# Patient Record
Sex: Male | Born: 2000 | Race: Black or African American | Hispanic: No | Marital: Single | Smoking: Never smoker
Health system: Southern US, Community
[De-identification: ages and names within clinical notes are randomized; demographics above are authoritative.]

## PROBLEM LIST (undated history)

## (undated) DIAGNOSIS — R011 Cardiac murmur, unspecified: Secondary | ICD-10-CM

## (undated) DIAGNOSIS — M41129 Adolescent idiopathic scoliosis, site unspecified: Secondary | ICD-10-CM

## (undated) DIAGNOSIS — J40 Bronchitis, not specified as acute or chronic: Secondary | ICD-10-CM

## (undated) HISTORY — DX: Adolescent idiopathic scoliosis, site unspecified: M41.129

## (undated) HISTORY — PX: WISDOM TOOTH EXTRACTION: SHX21

---
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
## 2022-10-17 ENCOUNTER — Ambulatory Visit (INDEPENDENT_AMBULATORY_CARE_PROVIDER_SITE_OTHER): Payer: Medicaid Other | Admitting: Pediatrics

## 2022-10-17 ENCOUNTER — Encounter: Payer: Self-pay | Admitting: Pediatrics

## 2022-10-17 VITALS — BP 122/80 | HR 66 | Ht 71.06 in | Wt 249.4 lb

## 2022-10-17 DIAGNOSIS — J069 Acute upper respiratory infection, unspecified: Secondary | ICD-10-CM

## 2022-10-17 DIAGNOSIS — J101 Influenza due to other identified influenza virus with other respiratory manifestations: Secondary | ICD-10-CM | POA: Diagnosis not present

## 2022-10-17 LAB — POC SOFIA 2 FLU + SARS ANTIGEN FIA
Influenza A, POC: NEGATIVE
Influenza B, POC: POSITIVE — AB
SARS Coronavirus 2 Ag: NEGATIVE

## 2022-10-17 NOTE — Progress Notes (Signed)
Patient Name:  Daniel Chandler Date of Birth:  Jan 04, 2001 Age:  21 y.o. Date of Visit:  10/17/2022   Accompanied by:  self    (primary historianself) Interpreter:  none  Subjective:    Daniel Chandler  is a 21 y.o. here for  Chief Complaint  Patient presents with   Cough   Nasal Congestion    Accompanied by self    Cough This is a new problem. The current episode started in the past 7 days (7-8 days ago). The problem has been gradually improving. Associated symptoms include nasal congestion and rhinorrhea. Pertinent negatives include no chest pain, fever or sore throat. There is no history of asthma or environmental allergies.    Past Medical History:  Diagnosis Date   Adolescent idiopathic scoliosis    Thoracolumbar junction, 15 degrees of curvature with about 30 degrees of rotation, increased kyphosis of the thoracic spine.  Increased lordosis of the lumbar spine.   Asthma    Bronchitis    Heart murmur    Morbid obesity ([facility]) 08/27/2020   BMI is 38 at 21 years of age (greater than the 99th percentile)     History reviewed. No pertinent surgical history.   Family History  Problem Relation Age of Onset   Blindness Father    Diabetes type II Father    Hypertension Father    Heart attack Father    Stroke Father    Atrial fibrillation Father    Stroke Maternal Grandmother    Hypertension Maternal Grandmother    Diabetes type II Maternal Grandmother    Thyroid disease Maternal Grandmother    Cataracts Maternal Grandmother    Glaucoma Maternal Grandmother    Hypertension Maternal Grandfather    Diabetes type II Maternal Grandfather    Kidney cancer Maternal Grandfather    Kidney disease Maternal Grandfather    Cataracts Maternal Grandfather    Diabetes Paternal Grandmother    Hypertension Paternal Grandmother    Blindness Paternal Grandmother    Diabetes type II Paternal Grandmother    Early death Paternal Grandfather     Current Meds  Medication Sig   fluticasone  (FLONASE) 50 MCG/ACT nasal spray Place 1 spray into both nostrils daily.   Olopatadine HCl 0.2 % SOLN Apply 1 drop to eye daily.       No Known Allergies  Review of Systems  Constitutional:  Negative for fever.  HENT:  Positive for congestion and rhinorrhea. Negative for sore throat.   Respiratory:  Positive for cough.   Cardiovascular:  Negative for chest pain.  Gastrointestinal:  Negative for abdominal pain, diarrhea, nausea and vomiting.  Endo/Heme/Allergies:  Negative for environmental allergies.     Objective:   Blood pressure 122/80, pulse 66, height 5' 11.06" (1.805 m), weight 249 lb 6.4 oz (113.1 kg), SpO2 99 %.  Physical Exam Constitutional:      General: He is not in acute distress. HENT:     Right Ear: Tympanic membrane normal.     Left Ear: Tympanic membrane normal.     Nose: Congestion present. No rhinorrhea.     Mouth/Throat:     Pharynx: No oropharyngeal exudate or posterior oropharyngeal erythema.  Cardiovascular:     Pulses: Normal pulses.  Pulmonary:     Effort: Pulmonary effort is normal. No respiratory distress.     Breath sounds: Normal breath sounds. No wheezing.  Abdominal:     General: Bowel sounds are normal.     Palpations: Abdomen is soft.  Lymphadenopathy:  Cervical: No cervical adenopathy.      IN-HOUSE Laboratory Results:    Results for orders placed or performed in visit on 10/17/22  POC SOFIA 2 FLU + SARS ANTIGEN FIA  Result Value Ref Range   Influenza A, POC Negative Negative   Influenza B, POC Positive (A) Negative   SARS Coronavirus 2 Ag Negative Negative     Assessment and plan:   Patient is here for   1. Influenza due to influenza virus, type B  Symptoms are improving, started >7 days ago. Talked about supportive care and monitoring.   -Supportive care, symptom management, and monitoring were discussed -Monitor for fever, respiratory distress, and dehydration  -Indications to return to clinic and/or ER  reviewed -Use of nasal saline, cool mist humidifier, and fever control reviewed   2. Viral URI - POC SOFIA 2 FLU + SARS ANTIGEN FIA     Return if symptoms worsen or fail to improve.

## 2023-04-25 ENCOUNTER — Ambulatory Visit (INDEPENDENT_AMBULATORY_CARE_PROVIDER_SITE_OTHER): Payer: Medicaid Other | Admitting: Internal Medicine

## 2023-04-25 ENCOUNTER — Encounter: Payer: Self-pay | Admitting: Internal Medicine

## 2023-04-25 VITALS — BP 127/78 | HR 67 | Resp 16 | Ht 73.0 in | Wt 239.0 lb

## 2023-04-25 DIAGNOSIS — R7309 Other abnormal glucose: Secondary | ICD-10-CM | POA: Diagnosis not present

## 2023-04-25 DIAGNOSIS — E782 Mixed hyperlipidemia: Secondary | ICD-10-CM

## 2023-04-25 NOTE — Progress Notes (Unsigned)
HPI:[name] is a 22 y.o. male  who is not currently being treating for any chronic medical conditions who is here to establish care. Patient was followed by pediatrician before today. Previously treated for asthma but not currently needing inhaler. He was morbidly obese and losing weight. He has been making better food choices and purchases his own food since starting a full time job.  He works at [employer] 7 am to 7pm  , 2 days on and 2 days off. He has a hard time falling asleep sometimes and had bad dreams when taking melatonin. His parents lost their home to a fire and living with his grandmother currently. He also hit is nail with a hammer and would like it evaluated. He has an area on his right ribs which he would like to be evaluated.   Past Medical History:  Diagnosis Date   Adolescent idiopathic scoliosis    Thoracolumbar junction, 15 degrees of curvature with about 30 degrees of rotation, increased kyphosis of the thoracic spine.  Increased lordosis of the lumbar spine.   Asthma    Bronchitis    Heart murmur    Morbid obesity ([facility]) 08/27/2020   BMI is 14 at 22 years of age (greater than the 99th percentile)    Past Surgical History:  Procedure Laterality Date   WISDOM TOOTH EXTRACTION      Family History  Problem Relation Age of Onset   Blindness Father    Diabetes type II Father    Hypertension Father    Heart attack Father    Stroke Father    Atrial fibrillation Father    Stroke Maternal Grandmother    Hypertension Maternal Grandmother    Diabetes type II Maternal Grandmother    Thyroid disease Maternal Grandmother    Cataracts Maternal Grandmother    Glaucoma Maternal Grandmother    Hypertension Maternal Grandfather    Diabetes type II Maternal Grandfather    Kidney cancer Maternal Grandfather    Kidney disease Maternal Grandfather    Cataracts Maternal Grandfather    Diabetes Paternal Grandmother    Hypertension Paternal Grandmother    Blindness  Paternal Grandmother    Diabetes type II Paternal Grandmother    Early death Paternal Grandfather     Social History   Tobacco Use   Smoking status: Never    Passive exposure: Yes   Smokeless tobacco: Never   Tobacco comments:    mom smokes outside  Substance Use Topics   Alcohol use: No   Drug use: No      Physical Exam: Vitals:   04/25/23 1118  BP: 127/78  Pulse: 67  Resp: 16  SpO2: 97%  Weight: 239 lb (108.4 kg)  Height: 6\' 1"  (1.854 m)     Physical Exam Constitutional:      General: He is not in acute distress.    Appearance: He is obese.  Cardiovascular:     Rate and Rhythm: Normal rate and regular rhythm.     Heart sounds: No murmur heard. Pulmonary:     Effort: Pulmonary effort is normal.     Breath sounds: No wheezing or rales.  Musculoskeletal:     Thoracic back: Scoliosis present.     Comments: Well circumscribed nodule at bottom of right ribs cage  Skin:    Comments: Subungual hematoma  Psychiatric:        Mood and Affect: Mood normal.        Behavior: Behavior normal.  Assessment & Plan:   Adolescent idiopathic scoliosis Chronic problem, stable. Recommend continued stretches and exercises he was given.  As mentioned in overview patient seen by Daniel Chandler. Reviewed notes from 2022 after office visit. Plan to follow up in 6 months. Will discuss follow up with Daniel Chandler and Daniel Chandler when returning labs results to patient  Morbid obesity [facility]) Encourage to continue making healthy food choices and continue exercising. Given general exercise recommendation as follows:Look for ways to reduce time sitting and increase time moving. For example, make it a tradition to walk before or after dinner. I recommend at least 150 minutes of moderate-intensity physical activity a week for adults. You might split that into 30 minutes, 5 days a week.   Mixed hyperlipidemia Reviewed previous labs and patient has elevated LDL and triglycerides . Will check lipid  panel today.   Elevated hemoglobin A1c Pediatric A1C on lab review 2 years ago. Will check hemoglobin A1C today to monitor Reviewed lifestyle interventions.    Daniel Banister, MD

## 2023-04-25 NOTE — Patient Instructions (Signed)
Thank you, [name] for allowing [place] to provide your care today.   I have ordered the following labs for you:   Lab Orders         Lipid panel         Hemoglobin A1c         CMP14+EGFR           Thurmon Fair, M.D.

## 2023-04-26 DIAGNOSIS — E782 Mixed hyperlipidemia: Secondary | ICD-10-CM | POA: Insufficient documentation

## 2023-04-26 DIAGNOSIS — R7309 Other abnormal glucose: Secondary | ICD-10-CM | POA: Insufficient documentation

## 2023-04-26 LAB — CMP14+EGFR
ALT: 24 IU/L (ref 0–44)
AST: 17 IU/L (ref 0–40)
Albumin/Globulin Ratio: 1.8 (ref 1.2–2.2)
Albumin: 4.2 g/dL — ABNORMAL LOW (ref 4.3–5.2)
Alkaline Phosphatase: 95 IU/L (ref 51–125)
BUN/Creatinine Ratio: 10 (ref 9–20)
BUN: 10 mg/dL (ref 6–20)
Bilirubin Total: 0.7 mg/dL (ref 0.0–1.2)
CO2: 25 mmol/L (ref 20–29)
Calcium: 9.6 mg/dL (ref 8.7–10.2)
Chloride: 103 mmol/L (ref 96–106)
Creatinine, Ser: 1.02 mg/dL (ref 0.76–1.27)
Globulin, Total: 2.4 g/dL (ref 1.5–4.5)
Glucose: 84 mg/dL (ref 70–99)
Potassium: 4.2 mmol/L (ref 3.5–5.2)
Sodium: 141 mmol/L (ref 134–144)
Total Protein: 6.6 g/dL (ref 6.0–8.5)
eGFR: 109 mL/min/{1.73_m2} (ref >59)

## 2023-04-26 LAB — LIPID PANEL
Chol/HDL Ratio: 4.2 ratio (ref 0.0–5.0)
Cholesterol, Total: 172 mg/dL — ABNORMAL HIGH (ref 100–169)
HDL: 41 mg/dL (ref >39)
LDL Chol Calc (NIH): 112 mg/dL — ABNORMAL HIGH (ref 0–109)
Triglycerides: 101 mg/dL — ABNORMAL HIGH (ref 0–89)
VLDL Cholesterol Cal: 19 mg/dL (ref 5–40)

## 2023-04-26 LAB — HEMOGLOBIN A1C
Est. average glucose Bld gHb Est-mCnc: 114 mg/dL
Hgb A1c MFr Bld: 5.6 % (ref 4.8–5.6)

## 2023-04-26 NOTE — Assessment & Plan Note (Signed)
Encourage to continue making healthy food choices and continue exercising. Given general exercise recommendation as follows:Look for ways to reduce time sitting and increase time moving. For example, make it a tradition to walk before or after dinner. I recommend at least 150 minutes of moderate-intensity physical activity a week for adults. You might split that into 30 minutes, 5 days a week.

## 2023-04-26 NOTE — Assessment & Plan Note (Signed)
Pediatric A1C on lab review 2 years ago. Will check hemoglobin A1C today to monitor Reviewed lifestyle interventions.

## 2023-04-26 NOTE — Assessment & Plan Note (Signed)
Chronic problem, stable. Recommend continued stretches and exercises he was given.  As mentioned in overview patient seen by [name]. Reviewed notes from 2022 after office visit. Plan to follow up in 6 months. Will discuss follow up with Daniel Chandler and Daniel Chandler when returning labs results to patient

## 2023-04-26 NOTE — Assessment & Plan Note (Signed)
Reviewed previous labs and patient has elevated LDL and triglycerides . Will check lipid panel today.

## 2023-05-01 ENCOUNTER — Encounter: Payer: Self-pay | Admitting: Internal Medicine

## 2023-10-09 ENCOUNTER — Ambulatory Visit: Payer: Self-pay | Admitting: Internal Medicine

## 2023-10-09 NOTE — Telephone Encounter (Signed)
Copied from CRM [id number]. Topic: Clinical - Red Word Triage >> Oct 09, 2023  9:18 AM Deaijah H wrote: Red Word that prompted transfer to Nurse Triage: Back swollen to touch / pt std back feels "funny"  Chief Complaint: Back pain and swollen Symptoms:  Swollen at mid  and lower back Frequency:    History of Scoliosis-  Constant since moving furniture last week. Pertinent Negatives: Patient denies fever. Disposition: [] ED /[] Urgent Care (no appt availability in office) / [x] Appointment(In office/virtual)/ []  [place] [facility]/ [] Home Care/ [] Refused Recommended Disposition /[]  Mobile Bus/ []  Follow-up with PCP Additional Notes:   GM requesting to be scheduled with Dr. Allena Katz.  Reason for Disposition  [1] MODERATE back pain (e.g., interferes with normal activities) AND [2] present > 3 days  Answer Assessment - Initial Assessment Questions 1. ONSET: "When did the pain begin?"       Last week  . Gm states they were moving furniture. 2. LOCATION: "Where does it hurt?" (upper, mid or lower back)     Mid and lower back.  Occasionally  he will have some swelling at his neck. Mother reports upper shoulder, neck and back are swollen. 3. SEVERITY: "How bad is the pain?"  (e.g., Scale 1-10; mild, moderate, or severe) Pain was a 6 on last week and now it is a 10 on pain scale. Mod- severe   - MODERATE (4-7): Interferes with normal activities or awakens from sleep.    - SEVERE (8-10): Excruciating pain, unable to do any normal activities.       4. PATTERN: "Is the pain constant?" yes; constant    5. RADIATION: "Does the pain shoot into your legs or somewhere else?"     Denies. 6. CAUSE:  "What do you think is causing the back pain?"  Diagnosed with Scoliosis      GM reports they moved furniture last week and that may have triggered. 7. BACK OVERUSE:  "Any recent lifting of heavy objects, strenuous work or exercise?"      Yes, moving furniture 8. MEDICINES: "What have you taken so far  for the pain?" (e.g., nothing, acetaminophen, NSAIDS) None. He is unable to take NSAIDS.     9. NEUROLOGIC SYMPTOMS: "Do you have any weakness, numbness, or problems with bowel/bladder control?" Denies      10. OTHER SYMPTOMS: "Do you have any other symptoms?" (e.g., fever, abdomen pain, burning with urination, blood in urine)        Denies  Protocols used: Back Pain-A-AH

## 2023-10-09 NOTE — Telephone Encounter (Signed)
Copied from CRM [id number]. Topic: Clinical - Red Word Triage >> Oct 09, 2023  9:18 AM Deaijah H wrote: Red Word that prompted transfer to Nurse Triage: Back swollen to touch / pt std back feels "funny"

## 2023-10-12 ENCOUNTER — Ambulatory Visit (INDEPENDENT_AMBULATORY_CARE_PROVIDER_SITE_OTHER): Payer: Medicaid Other | Admitting: Internal Medicine

## 2023-10-12 ENCOUNTER — Encounter: Payer: Self-pay | Admitting: Internal Medicine

## 2023-10-12 VITALS — BP 136/82 | HR 67 | Ht 74.0 in | Wt 242.4 lb

## 2023-10-12 DIAGNOSIS — M549 Dorsalgia, unspecified: Secondary | ICD-10-CM | POA: Insufficient documentation

## 2023-10-12 DIAGNOSIS — Z8739 Personal history of other diseases of the musculoskeletal system and connective tissue: Secondary | ICD-10-CM | POA: Diagnosis not present

## 2023-10-12 DIAGNOSIS — M545 Low back pain, unspecified: Secondary | ICD-10-CM | POA: Diagnosis not present

## 2023-10-12 DIAGNOSIS — G8929 Other chronic pain: Secondary | ICD-10-CM | POA: Diagnosis not present

## 2023-10-12 MED ORDER — CELECOXIB 100 MG PO CAPS: 100.0000 mg | ORAL_CAPSULE | Freq: Every day | ORAL | 0 refills | Status: DC

## 2023-10-12 MED ORDER — CYCLOBENZAPRINE HCL 5 MG PO TABS: 5.0000 mg | ORAL_TABLET | Freq: Two times a day (BID) | ORAL | 0 refills | Status: DC | PRN

## 2023-10-12 NOTE — Assessment & Plan Note (Addendum)
Has a history of thoracolumbar scoliosis and lumbar lordosis Has been in evaluated by orthopedic surgeon in 2022 Was supposed to get repeat X-ray and evaluation, will get X-ray of thoracic and lumbar spine

## 2023-10-12 NOTE — Patient Instructions (Signed)
Please take Celecoxib as needed for pain.  Please take Cyclobenzaprine as needed for muscle spasms/stiffness.

## 2023-10-12 NOTE — Assessment & Plan Note (Signed)
Likely due to muscular strain around scapular area Celebrex as needed for pain Flexeril as needed for muscle spasms Advised to apply warm compresses

## 2023-10-12 NOTE — Progress Notes (Signed)
Acute Office Visit  Subjective:    Patient ID: Daniel Chandler, male    DOB: [birth year], 22 y.o.   MRN: [id number]  Chief Complaint  Patient presents with   Back Pain    Back pain , patient bent over and felt something popped Monday morning     HPI Patient is in today for complaint of mid back pain since 10/06/23, when he tried to lift a heavy bag.  He felt a popping sound and started having pain in the left mid back area from the next day.  He has noticed mild improvement in the pain since then, but has constant, dull pain, worse with shoulder/arm movement.  Of note, he also reports chronic low back pain, worse with prolonged sitting.  He has history of scoliosis and kyphosis of the thoracic spine, lordosis of the lumbar spine.  Has been evaluated by orthopedic surgeon at [facility] in 2022.  Denies any numbness or tingling of the LE.  Past Medical History:  Diagnosis Date   Adolescent idiopathic scoliosis    Thoracolumbar junction, 15 degrees of curvature with about 30 degrees of rotation, increased kyphosis of the thoracic spine.  Increased lordosis of the lumbar spine.   Asthma    Bronchitis    Heart murmur    Morbid obesity (HCC) 08/27/2020   BMI is 79 at 22 years of age (greater than the 99th percentile)    Past Surgical History:  Procedure Laterality Date   WISDOM TOOTH EXTRACTION      Family History  Problem Relation Age of Onset   Blindness Father    Diabetes type II Father    Hypertension Father    Heart attack Father    Stroke Father    Atrial fibrillation Father    Stroke Maternal Grandmother    Hypertension Maternal Grandmother    Diabetes type II Maternal Grandmother    Thyroid disease Maternal Grandmother    Cataracts Maternal Grandmother    Glaucoma Maternal Grandmother    Hypertension Maternal Grandfather    Diabetes type II Maternal Grandfather    Kidney cancer Maternal Grandfather    Kidney disease Maternal Grandfather    Cataracts  Maternal Grandfather    Diabetes Paternal Grandmother    Hypertension Paternal Grandmother    Blindness Paternal Grandmother    Diabetes type II Paternal Grandmother    Early death Paternal Grandfather     Social History   Socioeconomic History   Marital status: Single    Spouse name: Not on file   Number of children: Not on file   Years of education: Not on file   Highest education level: Not on file  Occupational History   Occupation: [employer]  Tobacco Use   Smoking status: Never    Passive exposure: Yes   Smokeless tobacco: Never   Tobacco comments:    mom smokes outside  Substance and Sexual Activity   Alcohol use: No   Drug use: No   Sexual activity: Not on file  Other Topics Concern   Not on file  Social History Narrative   Lives with mom and sister.    He is in 9th grade at [employer].    He enjoys music, coloring, and building things.    Social Determinants of Health   Financial Resource Strain: Not on file  Food Insecurity: Not on file  Transportation Needs: Not on file  Physical Activity: Not on file  Stress: Not on file  Social Connections:  Not on file  Intimate Partner Violence: Not on file    No outpatient medications prior to visit.   No facility-administered medications prior to visit.    No Known Allergies  Review of Systems  Constitutional:  Negative for chills and fever.  HENT:  Negative for congestion and sore throat.   Eyes:  Negative for pain and discharge.  Respiratory:  Negative for cough and shortness of breath.   Cardiovascular:  Negative for chest pain and palpitations.  Gastrointestinal:  Negative for diarrhea, nausea and vomiting.  Endocrine: Negative for polydipsia and polyuria.  Genitourinary:  Negative for dysuria and hematuria.  Musculoskeletal:  Positive for back pain. Negative for neck pain and neck stiffness.  Skin:  Negative for rash.  Neurological:  Negative for dizziness, weakness, numbness and headaches.   Psychiatric/Behavioral:  Negative for agitation and behavioral problems.        Objective:    Physical Exam Vitals reviewed.  Constitutional:      General: He is not in acute distress.    Appearance: He is obese. He is not diaphoretic.  HENT:     Nose: Nose normal.     Mouth/Throat:     Mouth: Mucous membranes are moist.  Eyes:     General: No scleral icterus.    Extraocular Movements: Extraocular movements intact.  Cardiovascular:     Rate and Rhythm: Normal rate and regular rhythm.     Heart sounds: Normal heart sounds. No murmur heard. Pulmonary:     Breath sounds: Normal breath sounds. No wheezing or rales.  Musculoskeletal:     Cervical back: Neck supple. No tenderness.     Thoracic back: Tenderness (Left paraspinal/scapular) present. No bony tenderness.     Lumbar back: No tenderness. Normal range of motion.     Right lower leg: No edema.     Left lower leg: No edema.  Skin:    General: Skin is warm.     Findings: No rash.  Neurological:     General: No focal deficit present.     Mental Status: He is alert and oriented to person, place, and time.  Psychiatric:        Mood and Affect: Mood normal.        Behavior: Behavior normal.     BP 136/82 (BP Location: Right Arm, Patient Position: Sitting, Cuff Size: Large)   Pulse 67   Ht 6\' 2"  (1.88 m)   Wt 242 lb 6.4 oz (110 kg)   SpO2 99%   BMI 31.12 kg/m  Wt Readings from Last 3 Encounters:  10/12/23 242 lb 6.4 oz (110 kg) (99%, Z= 2.26)*  04/25/23 239 lb (108.4 kg) (99%, Z= 2.23)*  10/17/22 249 lb 6.4 oz (113.1 kg) (>99%, Z= 2.41)*   * Growth percentiles are based on CDC (Boys, 2-20 Years) data.        Assessment & Plan:   Problem List Items Addressed This Visit       Other   Acute mid back pain - Primary    Likely due to muscular strain around scapular area Celebrex as needed for pain Flexeril as needed for muscle spasms Advised to apply warm compresses      Relevant Medications   celecoxib  (CELEBREX) 100 MG capsule   cyclobenzaprine (FLEXERIL) 5 MG tablet   Other Relevant Orders   DG Thoracic Spine 1 View   Chronic low back pain without sciatica    Has a history of thoracolumbar scoliosis and lumbar lordosis  Has been in evaluated by orthopedic surgeon in 2022 Was supposed to get repeat X-ray and evaluation, will get X-ray of thoracic and lumbar spine      Relevant Medications   celecoxib (CELEBREX) 100 MG capsule   cyclobenzaprine (FLEXERIL) 5 MG tablet   Other Relevant Orders   DG Lumbar Spine Complete   Other Visit Diagnoses     History of scoliosis       Relevant Orders   DG Thoracic Spine 1 View   DG Lumbar Spine Complete        Meds ordered this encounter  Medications   celecoxib (CELEBREX) 100 MG capsule    Sig: Take 1 capsule (100 mg total) by mouth daily.    Dispense:  15 capsule    Refill:  0   cyclobenzaprine (FLEXERIL) 5 MG tablet    Sig: Take 1 tablet (5 mg total) by mouth 2 (two) times daily as needed for muscle spasms.    Dispense:  20 tablet    Refill:  0     Geran Haithcock Concha Se, MD

## 2023-10-25 ENCOUNTER — Ambulatory Visit: Payer: Medicaid Other | Admitting: Internal Medicine

## 2023-10-31 ENCOUNTER — Encounter: Payer: Self-pay | Admitting: Internal Medicine

## 2024-01-09 ENCOUNTER — Ambulatory Visit (HOSPITAL_COMMUNITY)
Admission: RE | Admit: 2024-01-09 | Discharge: 2024-01-09 | Disposition: A | Discharge status: Home / Self Care | Payer: Medicaid Other | Source: Ambulatory Visit | Attending: Internal Medicine | Admitting: Internal Medicine

## 2024-01-09 ENCOUNTER — Other Ambulatory Visit: Payer: Self-pay | Admitting: Internal Medicine

## 2024-01-09 ENCOUNTER — Ambulatory Visit: Payer: Self-pay | Admitting: Internal Medicine

## 2024-01-09 ENCOUNTER — Encounter: Payer: Self-pay | Admitting: Internal Medicine

## 2024-01-09 VITALS — BP 122/72 | HR 76 | Ht 74.0 in | Wt 242.6 lb

## 2024-01-09 DIAGNOSIS — R079 Chest pain, unspecified: Secondary | ICD-10-CM | POA: Insufficient documentation

## 2024-01-09 DIAGNOSIS — Z114 Encounter for screening for human immunodeficiency virus [HIV]: Secondary | ICD-10-CM | POA: Diagnosis not present

## 2024-01-09 DIAGNOSIS — G8929 Other chronic pain: Secondary | ICD-10-CM

## 2024-01-09 DIAGNOSIS — Z1159 Encounter for screening for other viral diseases: Secondary | ICD-10-CM | POA: Diagnosis not present

## 2024-01-09 DIAGNOSIS — M549 Dorsalgia, unspecified: Secondary | ICD-10-CM

## 2024-01-09 DIAGNOSIS — Z8739 Personal history of other diseases of the musculoskeletal system and connective tissue: Secondary | ICD-10-CM | POA: Diagnosis not present

## 2024-01-09 DIAGNOSIS — M546 Pain in thoracic spine: Secondary | ICD-10-CM | POA: Diagnosis not present

## 2024-01-09 DIAGNOSIS — M419 Scoliosis, unspecified: Secondary | ICD-10-CM | POA: Diagnosis not present

## 2024-01-09 DIAGNOSIS — M545 Low back pain, unspecified: Secondary | ICD-10-CM | POA: Insufficient documentation

## 2024-01-09 DIAGNOSIS — R7303 Prediabetes: Secondary | ICD-10-CM | POA: Diagnosis not present

## 2024-01-09 DIAGNOSIS — R0789 Other chest pain: Secondary | ICD-10-CM | POA: Diagnosis not present

## 2024-01-09 DIAGNOSIS — E782 Mixed hyperlipidemia: Secondary | ICD-10-CM | POA: Diagnosis not present

## 2024-01-09 DIAGNOSIS — M40204 Unspecified kyphosis, thoracic region: Secondary | ICD-10-CM | POA: Diagnosis not present

## 2024-01-09 MED ORDER — CYCLOBENZAPRINE HCL 5 MG PO TABS: 5.0000 mg | ORAL_TABLET | Freq: Two times a day (BID) | ORAL | 0 refills | Status: AC | PRN

## 2024-01-09 MED ORDER — CELECOXIB 100 MG PO CAPS: 100.0000 mg | ORAL_CAPSULE | Freq: Every day | ORAL | 1 refills | Status: AC

## 2024-01-09 NOTE — Addendum Note (Signed)
Addended byTrena Platt on: 01/09/2024 09:16 AM   Modules accepted: Orders

## 2024-01-09 NOTE — Patient Instructions (Signed)
Please take Celebrex as needed for right sided scapular pain.  Please take Flexeril as needed for muscle spasms.  Okay to apply heating pad and/or pain relieving cream as needed.  Please get X-ray chest, thoracic and lumbar spine at [facility].

## 2024-01-09 NOTE — Assessment & Plan Note (Addendum)
Continue to follow low-carb diet Check lipid profile

## 2024-01-09 NOTE — Assessment & Plan Note (Signed)
Has a history of thoracolumbar scoliosis and lumbar lordosis Has been evaluated by orthopedic surgeon in 2022 Was supposed to get repeat X-ray and evaluation, advised to get X-ray of thoracic and lumbar spine

## 2024-01-09 NOTE — Progress Notes (Signed)
Established Patient Office Visit  Subjective:  Patient ID: Daniel Chandler, male    DOB: 26-Oct-2001  Age: 23 y.o. MRN: [id number]  CC:  Chief Complaint  Patient presents with   Back Pain    Pt reports sx of back pain flare up.     HPI Kristy Catoe is a 23 y.o. male with past medical history of HLD, prediabetes and obesity who presents for f/u of chronic medical conditions and acute scapular area pain.  He complains of mid back pain and right scapular area pain since 01/06/24.  Denies any recent heavy lifting or bending.  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  He had similar scapular area pain in 11/24 when he tried to lift a heavy bag.  He felt a popping sound and started having pain in the left mid back area from the next day.  He was given Celebrex and Flexeril, which had resolved his pain.  Of note, he also reports chronic low back pain, worse with prolonged sitting.  He has history of scoliosis and kyphosis of the thoracic spine, lordosis of the lumbar spine.  Has been evaluated by orthopedic surgeon at [facility] in 2022.  Denies any numbness or tingling of the LE.  He has not done x-rays of of cervical and lumbar spine from previous visit.  Prediabetes and HLD: He has been following low-carb diet and avoids fried food.  Past Medical History:  Diagnosis Date   Adolescent idiopathic scoliosis    Thoracolumbar junction, 15 degrees of curvature with about 30 degrees of rotation, increased kyphosis of the thoracic spine.  Increased lordosis of the lumbar spine.   Asthma    Bronchitis    Heart murmur    Morbid obesity (HCC) 08/27/2020   BMI is 60 at 23 years of age (greater than the 99th percentile)    Past Surgical History:  Procedure Laterality Date   WISDOM TOOTH EXTRACTION      Family History  Problem Relation Age of Onset   Blindness Father    Diabetes type II Father    Hypertension Father    Heart attack  Father    Stroke Father    Atrial fibrillation Father    Stroke Maternal Grandmother    Hypertension Maternal Grandmother    Diabetes type II Maternal Grandmother    Thyroid disease Maternal Grandmother    Cataracts Maternal Grandmother    Glaucoma Maternal Grandmother    Hypertension Maternal Grandfather    Diabetes type II Maternal Grandfather    Kidney cancer Maternal Grandfather    Kidney disease Maternal Grandfather    Cataracts Maternal Grandfather    Diabetes Paternal Grandmother    Hypertension Paternal Grandmother    Blindness Paternal Grandmother    Diabetes type II Paternal Grandmother    Early death Paternal Grandfather     Social History   Socioeconomic History   Marital status: Single    Spouse name: Not on file   Number of children: Not on file   Years of education: Not on file   Highest education level: Not on file  Occupational History   Occupation: [employer]  Tobacco Use   Smoking status: Never    Passive exposure: Yes   Smokeless tobacco: Never   Tobacco comments:    mom smokes outside  Substance and Sexual Activity   Alcohol use: No   Drug use: No   Sexual activity: Not on file  Other Topics  Concern   Not on file  Social History Narrative   Lives with mom and sister.    He is in 9th grade at [employer].    He enjoys music, coloring, and building things.    Social Drivers of Corporate investment banker Strain: Not on file  Food Insecurity: Not on file  Transportation Needs: Not on file  Physical Activity: Not on file  Stress: Not on file  Social Connections: Not on file  Intimate Partner Violence: Not on file    Outpatient Medications Prior to Visit  Medication Sig Dispense Refill   celecoxib (CELEBREX) 100 MG capsule Take 1 capsule (100 mg total) by mouth daily. 15 capsule 0   cyclobenzaprine (FLEXERIL) 5 MG tablet Take 1 tablet (5 mg total) by mouth 2 (two) times daily as needed for muscle spasms. 20 tablet 0   No  facility-administered medications prior to visit.    No Known Allergies  ROS Review of Systems  Constitutional:  Negative for chills and fever.  HENT:  Negative for congestion and sore throat.   Eyes:  Negative for pain and discharge.  Respiratory:  Negative for cough and shortness of breath.   Cardiovascular:  Negative for chest pain and palpitations.  Gastrointestinal:  Negative for diarrhea, nausea and vomiting.  Endocrine: Negative for polydipsia and polyuria.  Genitourinary:  Negative for dysuria and hematuria.  Musculoskeletal:  Positive for back pain. Negative for neck pain and neck stiffness.  Skin:  Negative for rash.  Neurological:  Negative for dizziness, weakness, numbness and headaches.  Psychiatric/Behavioral:  Negative for agitation and behavioral problems.       Objective:    Physical Exam Vitals reviewed.  Constitutional:      General: He is not in acute distress.    Appearance: He is obese. He is not diaphoretic.  HENT:     Nose: Nose normal.     Mouth/Throat:     Mouth: Mucous membranes are moist.  Eyes:     General: No scleral icterus.    Extraocular Movements: Extraocular movements intact.  Cardiovascular:     Rate and Rhythm: Normal rate and regular rhythm.     Heart sounds: Normal heart sounds. No murmur heard. Pulmonary:     Breath sounds: Normal breath sounds. No wheezing or rales.  Musculoskeletal:     Cervical back: Neck supple. No tenderness.     Thoracic back: Tenderness (Right paraspinal/scapular) present. No bony tenderness.     Lumbar back: No tenderness. Normal range of motion.     Right lower leg: No edema.     Left lower leg: No edema.  Skin:    General: Skin is warm.     Findings: No rash.  Neurological:     General: No focal deficit present.     Mental Status: He is alert and oriented to person, place, and time.  Psychiatric:        Mood and Affect: Mood normal.        Behavior: Behavior normal.     BP 122/72   Pulse 76    Ht 6\' 2"  (1.88 m)   Wt 242 lb 9.6 oz (110 kg)   SpO2 99%   BMI 31.15 kg/m  Wt Readings from Last 3 Encounters:  01/09/24 242 lb 9.6 oz (110 kg)  10/12/23 242 lb 6.4 oz (110 kg) (99%, Z= 2.26)*  04/25/23 239 lb (108.4 kg) (99%, Z= 2.23)*   * Growth percentiles are based on CDC (  Boys, 2-20 Years) data.    No results found for: "TSH" Lab Results  Component Value Date   WBC 6.0 08/30/2020   HGB 14.5 08/30/2020   HCT 45.0 08/30/2020   MCV 82 08/30/2020   PLT 375 08/30/2020   Lab Results  Component Value Date   NA 141 04/25/2023   K 4.2 04/25/2023   CO2 25 04/25/2023   GLUCOSE 84 04/25/2023   BUN 10 04/25/2023   CREATININE 1.02 04/25/2023   BILITOT 0.7 04/25/2023   ALKPHOS 95 04/25/2023   AST 17 04/25/2023   ALT 24 04/25/2023   PROT 6.6 04/25/2023   ALBUMIN 4.2 (L) 04/25/2023   CALCIUM 9.6 04/25/2023   EGFR 109 04/25/2023   Lab Results  Component Value Date   CHOL 172 (H) 04/25/2023   Lab Results  Component Value Date   HDL 41 04/25/2023   Lab Results  Component Value Date   LDLCALC 112 (H) 04/25/2023   Lab Results  Component Value Date   TRIG 101 (H) 04/25/2023   Lab Results  Component Value Date   CHOLHDL 4.2 04/25/2023   Lab Results  Component Value Date   HGBA1C 5.6 04/25/2023      Assessment & Plan:   Problem List Items Addressed This Visit       Other   Mixed hyperlipidemia   Continue to follow low-carb diet Check lipid profile      Relevant Orders   CBC with Differential/Platelet   Lipid Profile   Acute mid back pain - Primary   Likely due to muscular strain around scapular area - has recurred due to repetitive movements at workplace Celebrex as needed for pain Flexeril as needed for muscle spasms Advised to apply warm compresses      Relevant Medications   celecoxib (CELEBREX) 100 MG capsule   cyclobenzaprine (FLEXERIL) 5 MG tablet   Other Relevant Orders   CBC with Differential/Platelet   Chronic low back pain without  sciatica   Has a history of thoracolumbar scoliosis and lumbar lordosis Has been evaluated by orthopedic surgeon in 2022 Was supposed to get repeat X-ray and evaluation, advised to get X-ray of thoracic and lumbar spine      Relevant Medications   celecoxib (CELEBREX) 100 MG capsule   cyclobenzaprine (FLEXERIL) 5 MG tablet   Prediabetes   Lab Results  Component Value Date   HGBA1C 5.6 04/25/2023   Continue to follow low-carb diet and perform moderate exercise/walking at least 150 minutes/week      Relevant Orders   CBC with Differential/Platelet   CMP14+EGFR   Hemoglobin A1c   Right-sided chest pain   Reports right sided chest wall pain, likely radicular from scapular area Check x-ray of chest to rule out ribs pathology, considering recurrent pain      Relevant Orders   DG Chest 2 View    Meds ordered this encounter  Medications   celecoxib (CELEBREX) 100 MG capsule    Sig: Take 1 capsule (100 mg total) by mouth daily.    Dispense:  30 capsule    Refill:  1   cyclobenzaprine (FLEXERIL) 5 MG tablet    Sig: Take 1 tablet (5 mg total) by mouth 2 (two) times daily as needed for muscle spasms.    Dispense:  20 tablet    Refill:  0    Follow-up: Return in about 6 months (around 07/08/2024).    Anabel Halon, MD

## 2024-01-09 NOTE — Assessment & Plan Note (Signed)
Likely due to muscular strain around scapular area - has recurred due to repetitive movements at workplace Celebrex as needed for pain Flexeril as needed for muscle spasms Advised to apply warm compresses

## 2024-01-09 NOTE — Assessment & Plan Note (Signed)
Reports right sided chest wall pain, likely radicular from scapular area Check x-ray of chest to rule out ribs pathology, considering recurrent pain

## 2024-01-09 NOTE — Assessment & Plan Note (Signed)
Lab Results  Component Value Date   HGBA1C 5.6 04/25/2023   Continue to follow low-carb diet and perform moderate exercise/walking at least 150 minutes/week

## 2024-03-21 ENCOUNTER — Encounter: Payer: Self-pay | Admitting: Internal Medicine

## 2024-03-21 ENCOUNTER — Ambulatory Visit: Admitting: Internal Medicine

## 2024-03-21 ENCOUNTER — Ambulatory Visit: Payer: Self-pay | Admitting: *Deleted

## 2024-03-21 VITALS — BP 110/76 | HR 80 | Ht 74.0 in | Wt 237.4 lb

## 2024-03-21 DIAGNOSIS — S46911A Strain of unspecified muscle, fascia and tendon at shoulder and upper arm level, right arm, initial encounter: Secondary | ICD-10-CM | POA: Diagnosis not present

## 2024-03-21 DIAGNOSIS — Z6831 Body mass index (BMI) 31.0-31.9, adult: Secondary | ICD-10-CM | POA: Diagnosis not present

## 2024-03-21 DIAGNOSIS — E669 Obesity, unspecified: Secondary | ICD-10-CM | POA: Diagnosis not present

## 2024-03-21 DIAGNOSIS — R030 Elevated blood-pressure reading, without diagnosis of hypertension: Secondary | ICD-10-CM | POA: Diagnosis not present

## 2024-03-21 DIAGNOSIS — L989 Disorder of the skin and subcutaneous tissue, unspecified: Secondary | ICD-10-CM | POA: Diagnosis not present

## 2024-03-21 DIAGNOSIS — M79621 Pain in right upper arm: Secondary | ICD-10-CM | POA: Diagnosis not present

## 2024-03-21 NOTE — Telephone Encounter (Signed)
  Chief Complaint: large inflamed area-upper R arm Symptoms: large,warm, painful area- upper R arm- hurt to touch, move Frequency: 1 week Pertinent Negatives: Patient denies fever Disposition: [] ED /[] Urgent Care (no appt availability in office) / [x] Appointment(In office/virtual)/ []  [place] [facility]/ [] Home Care/ [] Refused Recommended Disposition /[]  Mobile Bus/ []  Follow-up with PCP   Copied from CRM [id number]. Topic: Clinical - Red Word Triage >> Mar 21, 2024 11:47 AM DeAngela L wrote: Red Word that prompted transfer to Nurse Triage: Patient might have gotten bit by a spider or another jumping bug looks like a spider, his arm has a hard spot and also Pt says he was feeling hot and the house was really cold and he seem really tired and the sleep pattern is different in the last few days for him says his grandmother Reason for Disposition  [1] Swelling is painful to touch AND [2] no fever  Answer Assessment - Initial Assessment Questions 1. APPEARANCE of SWELLING: "What does it look like?"     Hard spot- large hard area, warm to touch 2. SIZE: "How large is the swelling?" (e.g., inches, cm; or compare to size of pinhead, tip of pen, eraser, coin, pea, grape, ping pong ball)      Very large area 3. LOCATION: "Where is the swelling located?"     R arm- upper 4. ONSET: "When did the swelling start?"     1 week- started with pain 5. COLOR: "What color is it?" "Is there more than one color?"     Hard to tell due to color of skin 6. PAIN: "Is there any pain?" If Yes, ask: "How bad is the pain?" (e.g., scale 1-10; or mild, moderate, severe)     - NONE (0): no pain   - MILD (1-3): doesn't interfere with normal activities    - MODERATE (4-7): interferes with normal activities or awakens from sleep    - SEVERE (8-10): excruciating pain, unable to do any normal activities     7/10 7. ITCH: "Does it itch?" If Yes, ask: "How bad is the itch?"      no 8. CAUSE: "What do you think  caused the swelling?"       unsure 9 OTHER SYMPTOMS: "Do you have any other symptoms?" (e.g., fever)     Unable to check  Protocols used: Skin Lump or Localized Swelling-A-AH

## 2024-03-21 NOTE — Patient Instructions (Signed)
 It was a pleasure to see you today.  Thank you for giving us  the opportunity to be involved in your care.  Below is a brief recap of your visit and next steps.  We will plan to see you again in August.  Summary I recommend as needed use of ice and hydrocortisone cream for symptom relief Follow up with Dr. Lydia Sams as scheduled in August.

## 2024-03-21 NOTE — Progress Notes (Signed)
   Acute Office Visit  Subjective:     Patient ID: Daniel Chandler, male    DOB: [birth year], 23 y.o.   MRN: [id number]  Chief Complaint  Patient presents with   Arm Pain    Painful and knot on right arm    Mr. Byrns presents today for evaluation of a painful lesion on the right upper arm.  He first noticed the lesion last evening.  It is itching and sore.  He is unaware of any other lesions.  He believes that he may have been bitten by an insect.  Review of Systems  Skin:        Painful, itching lesion on right upper arm      Objective:    BP 110/76   Pulse 80   Ht 6\' 2"  (1.88 m)   Wt 237 lb 6.4 oz (107.7 kg)   SpO2 98%   BMI 30.48 kg/m   Physical Exam Vitals reviewed.  Constitutional:      General: He is not in acute distress. Skin:    Findings: Lesion (Circumferential, erythematous, raised lesion on the right upper arm with evidence of excoriation.  No pustules or purulence is appreciated.) present.       Assessment & Plan:   Problem List Items Addressed This Visit       Skin lesion of right arm   Evaluated today for an acute visit endorsing a painful lesion on the right upper arm.  He first noticed the lesion yesterday.  It is sore and itches.  On exam there is a raised, erythematous lesion with evidence of excoriation.  No pustular purulence is appreciated.  It appears most consistent with an insect bite.  He denies systemic symptoms.  Treatment options reviewed.  I recommended application of ice and hydrocortisone cream for as needed pain and itch relief respectively.  He was instructed to return to care if lesion increases in size or he develops systemic symptoms.  Otherwise, he is scheduled for PCP follow-up in August.      Return if symptoms worsen or fail to improve.  Tobi Fortes, MD

## 2024-03-21 NOTE — Telephone Encounter (Signed)
 Appointment scheduled.

## 2024-04-11 ENCOUNTER — Encounter: Payer: Self-pay | Admitting: Internal Medicine

## 2024-04-11 DIAGNOSIS — L989 Disorder of the skin and subcutaneous tissue, unspecified: Secondary | ICD-10-CM | POA: Insufficient documentation

## 2024-04-11 NOTE — Assessment & Plan Note (Signed)
 Evaluated today for an acute visit endorsing a painful lesion on the right upper arm.  He first noticed the lesion yesterday.  It is sore and itches.  On exam there is a raised, erythematous lesion with evidence of excoriation.  No pustular purulence is appreciated.  It appears most consistent with an insect bite.  He denies systemic symptoms.  Treatment options reviewed.  I recommended application of ice and hydrocortisone cream for as needed pain and itch relief respectively.  He was instructed to return to care if lesion increases in size or he develops systemic symptoms.  Otherwise, he is scheduled for PCP follow-up in August.

## 2024-07-08 ENCOUNTER — Ambulatory Visit: Payer: Medicaid Other | Admitting: Internal Medicine

## 2024-07-12 DIAGNOSIS — R079 Chest pain, unspecified: Secondary | ICD-10-CM | POA: Diagnosis not present

## 2024-07-12 DIAGNOSIS — R9431 Abnormal electrocardiogram [ECG] [EKG]: Secondary | ICD-10-CM | POA: Diagnosis not present

## 2024-07-12 DIAGNOSIS — R072 Precordial pain: Secondary | ICD-10-CM | POA: Diagnosis not present

## 2024-07-16 ENCOUNTER — Encounter: Payer: Self-pay | Admitting: Internal Medicine

## 2024-08-03 DIAGNOSIS — Z6831 Body mass index (BMI) 31.0-31.9, adult: Secondary | ICD-10-CM | POA: Diagnosis not present

## 2024-08-03 DIAGNOSIS — E669 Obesity, unspecified: Secondary | ICD-10-CM | POA: Diagnosis not present

## 2024-08-03 DIAGNOSIS — R0981 Nasal congestion: Secondary | ICD-10-CM | POA: Diagnosis not present

## 2024-08-03 DIAGNOSIS — R030 Elevated blood-pressure reading, without diagnosis of hypertension: Secondary | ICD-10-CM | POA: Diagnosis not present

## 2024-08-03 DIAGNOSIS — J069 Acute upper respiratory infection, unspecified: Secondary | ICD-10-CM | POA: Diagnosis not present
